# Patient Record
Sex: Female | Born: 1985 | Race: Black or African American | Hispanic: No | Marital: Single | Smoking: Never smoker
Health system: Southern US, Community
[De-identification: ages and names within clinical notes are randomized; demographics above are authoritative.]

## PROBLEM LIST (undated history)

## (undated) DIAGNOSIS — Z789 Other specified health status: Secondary | ICD-10-CM

## (undated) HISTORY — PX: NO PAST SURGERIES: SHX2092

## (undated) HISTORY — DX: Other specified health status: Z78.9

---
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
## 2007-05-18 ENCOUNTER — Emergency Department (HOSPITAL_COMMUNITY): Admission: EM | Admit: 2007-05-18 | Discharge: 2007-05-18 | Payer: Self-pay | Admitting: Emergency Medicine

## 2008-01-15 ENCOUNTER — Emergency Department (HOSPITAL_COMMUNITY): Admission: EM | Admit: 2008-01-15 | Discharge: 2008-01-15 | Payer: Self-pay | Admitting: Emergency Medicine

## 2008-01-20 ENCOUNTER — Emergency Department (HOSPITAL_COMMUNITY): Admission: EM | Admit: 2008-01-20 | Discharge: 2008-01-20 | Payer: Self-pay | Admitting: Emergency Medicine

## 2008-06-18 ENCOUNTER — Emergency Department (HOSPITAL_COMMUNITY): Admission: EM | Admit: 2008-06-18 | Discharge: 2008-06-18 | Payer: Self-pay | Admitting: Emergency Medicine

## 2008-10-02 ENCOUNTER — Emergency Department (HOSPITAL_COMMUNITY): Admission: EM | Admit: 2008-10-02 | Discharge: 2008-10-02 | Payer: Self-pay | Admitting: Emergency Medicine

## 2011-03-13 ENCOUNTER — Other Ambulatory Visit: Payer: Self-pay | Admitting: Obstetrics & Gynecology

## 2011-04-20 NOTE — Op Note (Signed)
NAMECHELLI, YERKES             ACCOUNT NO.:  [id number]   MEDICAL RECORD NO.:  [id number]          PATIENT TYPE:  INP   LOCATION:  9122                          FACILITY:  [facility]   PHYSICIAN:  Kathreen Cosier, M.D.DATE OF BIRTH:  09-Nov-1986   DATE OF PROCEDURE:  08/24/2006  DATE OF DISCHARGE:  08/26/2006                                 OPERATIVE REPORT   Patient is a primigravida at term who pushed for two hours ineffectively.  Pushed to +3 station and midline episiotomy was cut and vacuum planned.  There was a 4th degree extension.  She delivered from LOA position of a  female, Apgars 7 and team was in attendance.  The fluid was meconium stained  and the bowel and bladder was DeLee'd prior to delivery of the shoulders.  There was a nuchal cord x1 which was reduced.  The placenta __________  intact and sent to pathology.  Fourth degree was repaired in layers with 2-0  chromic.  Post repair, the rectum and anus was intact.           ______________________________  Kathreen Cosier, M.D.     BAM/MEDQ  D:  08/24/2006  T:  08/27/2006  Job:  [id number]

## 2011-07-20 ENCOUNTER — Emergency Department (HOSPITAL_BASED_OUTPATIENT_CLINIC_OR_DEPARTMENT_OTHER)
Admission: EM | Admit: 2011-07-20 | Discharge: 2011-07-20 | Disposition: A | Discharge status: Home / Self Care | Payer: 59 | Attending: Emergency Medicine | Admitting: Emergency Medicine

## 2011-07-20 DIAGNOSIS — K219 Gastro-esophageal reflux disease without esophagitis: Secondary | ICD-10-CM | POA: Insufficient documentation

## 2011-07-20 DIAGNOSIS — R1084 Generalized abdominal pain: Secondary | ICD-10-CM

## 2011-07-20 DIAGNOSIS — A499 Bacterial infection, unspecified: Secondary | ICD-10-CM | POA: Insufficient documentation

## 2011-07-20 DIAGNOSIS — N760 Acute vaginitis: Secondary | ICD-10-CM | POA: Insufficient documentation

## 2011-07-20 DIAGNOSIS — B9689 Other specified bacterial agents as the cause of diseases classified elsewhere: Secondary | ICD-10-CM | POA: Insufficient documentation

## 2011-07-20 LAB — URINALYSIS, ROUTINE W REFLEX MICROSCOPIC
Glucose, UA: NEGATIVE mg/dL
Hgb urine dipstick: NEGATIVE
Specific Gravity, Urine: 1.029 (ref 1.005–1.030)
pH: 6.5 (ref 5.0–8.0)

## 2011-07-20 LAB — CBC
Hemoglobin: 13.4 g/dL (ref 12.0–15.0)
MCH: 29.5 pg (ref 26.0–34.0)
MCV: 84.2 fL (ref 78.0–100.0)
Platelets: 199 10*3/uL (ref 150–400)
RBC: 4.55 MIL/uL (ref 3.87–5.11)
WBC: 7.6 10*3/uL (ref 4.0–10.5)

## 2011-07-20 LAB — URINE MICROSCOPIC-ADD ON

## 2011-07-20 LAB — COMPREHENSIVE METABOLIC PANEL
ALT: 14 U/L (ref 0–35)
AST: 15 U/L (ref 0–37)
CO2: 27 mEq/L (ref 19–32)
Calcium: 9.5 mg/dL (ref 8.4–10.5)
Chloride: 102 mEq/L (ref 96–112)
Creatinine, Ser: 0.6 mg/dL (ref 0.50–1.10)
GFR calc Af Amer: >60 mL/min (ref >60)
GFR calc non Af Amer: >60 mL/min (ref >60)
Glucose, Bld: 94 mg/dL (ref 70–99)
Total Bilirubin: 1.2 mg/dL (ref 0.3–1.2)

## 2011-07-20 LAB — PREGNANCY, URINE: Preg Test, Ur: NEGATIVE

## 2011-07-20 LAB — WET PREP, GENITAL: Trich, Wet Prep: NONE SEEN

## 2011-07-20 MED ORDER — METRONIDAZOLE 500 MG PO TABS: 500.0000 mg | ORAL_TABLET | Freq: Two times a day (BID) | ORAL | Status: AC

## 2011-07-20 MED ORDER — OXYCODONE-ACETAMINOPHEN 5-325 MG PO TABS
1.0000 | ORAL_TABLET | Freq: Once | ORAL | Status: AC
  Administered 2011-07-20: 1 via ORAL
  Filled 2011-07-20: qty 1

## 2011-07-20 MED ORDER — HYDROCODONE-ACETAMINOPHEN 5-500 MG PO TABS: 1.0000 | ORAL_TABLET | Freq: Four times a day (QID) | ORAL | Status: AC | PRN

## 2011-07-20 NOTE — ED Notes (Signed)
Pt in nad at this time, during triage pt was texting on her cell phone carrying on a conversation with her friend who is at the bedside.  Her friend is eating her dinner which she brought from home.

## 2011-07-20 NOTE — ED Provider Notes (Signed)
History     CSN: [id number] Arrival date & time: 07/20/2011  8:44 PM  Chief Complaint  Patient presents with  . Abdominal Pain   HPI Comments: Pt states that she has been having on going pain for several months and it would happen a couple of times a month and was relieved with ibuprofen:pt state that in the last week the symptoms have been worse in that they are more frequent and they are not relieved with ibuprofen  Patient is a 25 y.o. female presenting with abdominal pain. The history is provided by the patient. No language interpreter was used.  Abdominal Pain The primary symptoms of the illness include abdominal pain. The primary symptoms of the illness do not include fever, fatigue, nausea, vomiting, diarrhea, hematemesis, dysuria, vaginal discharge or vaginal bleeding. The current episode started more than 2 days ago. The onset of the illness was gradual. The problem has been gradually worsening.  The patient states that she believes she is currently not pregnant. The patient has not had a change in bowel habit. Additional symptoms associated with the illness include frequency. Symptoms associated with the illness do not include chills, anorexia, diaphoresis, constipation, urgency or back pain. Significant associated medical issues do not include GERD, inflammatory bowel disease or substance abuse.    History reviewed. No pertinent past medical history.  History reviewed. No pertinent past surgical history.  History reviewed. No pertinent family history.  History  Substance Use Topics  . Smoking status: Never Smoker   . Smokeless tobacco: Never Used  . Alcohol Use: Yes     social drinker    OB History    Grav Para Term Preterm Abortions TAB SAB Ect Mult Living                  Review of Systems  Constitutional: Negative for fever, chills, diaphoresis and fatigue.  Gastrointestinal: Positive for abdominal pain. Negative for nausea, vomiting, diarrhea, constipation,  anorexia and hematemesis.  Genitourinary: Positive for frequency. Negative for dysuria, urgency, vaginal bleeding and vaginal discharge.  Musculoskeletal: Negative for back pain.  All other systems reviewed and are negative.    Physical Exam  BP 135/91  Pulse 81  Temp(Src) 98.4 F (36.9 C) (Oral)  Resp 18  Ht 5\' 5"  (1.651 m)  Wt 120 lb (54.432 kg)  BMI 19.97 kg/m2  SpO2 100%  Physical Exam  Nursing note and vitals reviewed. Constitutional: She is oriented to person, place, and time. She appears well-developed and well-nourished.  HENT:  Head: Normocephalic and atraumatic.  Eyes: Pupils are equal, round, and reactive to light.  Neck: Normal range of motion.  Cardiovascular: Normal rate and regular rhythm.   Pulmonary/Chest: Effort normal and breath sounds normal.  Abdominal: Soft. Bowel sounds are normal. She exhibits no mass. There is no tenderness. There is no guarding.  Genitourinary: Cervix exhibits no motion tenderness and no discharge. Vaginal discharge found.  Musculoskeletal: Normal range of motion.  Neurological: She is alert and oriented to person, place, and time.  Skin: Skin is warm and dry.  Psychiatric: She has a normal mood and affect.    ED Course  Procedures Results for orders placed during the hospital encounter of 07/20/11  URINALYSIS, ROUTINE W REFLEX MICROSCOPIC      Component Value Range   Color, Urine YELLOW  YELLOW    Appearance CLEAR  CLEAR    Specific Gravity, Urine 1.029  1.005 - 1.030    pH 6.5  5.0 - 8.0  Glucose, UA NEGATIVE  NEGATIVE (mg/dL)   Hgb urine dipstick NEGATIVE  NEGATIVE    Bilirubin Urine NEGATIVE  NEGATIVE    Ketones, ur NEGATIVE  NEGATIVE (mg/dL)   Protein, ur NEGATIVE  NEGATIVE (mg/dL)   Urobilinogen, UA 1.0  0.0 - 1.0 (mg/dL)   Nitrite NEGATIVE  NEGATIVE    Leukocytes, UA SMALL (*) NEGATIVE   PREGNANCY, URINE      Component Value Range   Preg Test, Ur NEGATIVE    URINE MICROSCOPIC-ADD ON      Component Value Range     Squamous Epithelial / LPF RARE  RARE    WBC, UA 3-6  <3 (WBC/hpf)   Bacteria, UA RARE  RARE   WET PREP, GENITAL      Component Value Range   Yeast, Wet Prep NONE SEEN  NONE SEEN    Trich, Wet Prep NONE SEEN  NONE SEEN    Clue Cells, Wet Prep FEW (*) NONE SEEN    WBC, Wet Prep HPF POC MANY (*) NONE SEEN   CBC      Component Value Range   WBC 7.6  4.0 - 10.5 (K/uL)   RBC 4.55  3.87 - 5.11 (MIL/uL)   Hemoglobin 13.4  12.0 - 15.0 (g/dL)   HCT 47.8  29.5 - 62.1 (%)   MCV 84.2  78.0 - 100.0 (fL)   MCH 29.5  26.0 - 34.0 (pg)   MCHC 35.0  30.0 - 36.0 (g/dL)   RDW 30.8  65.7 - 84.6 (%)   Platelets 199  150 - 400 (K/uL)  COMPREHENSIVE METABOLIC PANEL      Component Value Range   Sodium 139  135 - 145 (mEq/L)   Potassium 3.7  3.5 - 5.1 (mEq/L)   Chloride 102  96 - 112 (mEq/L)   CO2 27  19 - 32 (mEq/L)   Glucose, Bld 94  70 - 99 (mg/dL)   BUN 9  6 - 23 (mg/dL)   Creatinine, Ser 9.62  0.50 - 1.10 (mg/dL)   Calcium 9.5  8.4 - 95.2 (mg/dL)   Total Protein 7.6  6.0 - 8.3 (g/dL)   Albumin 4.5  3.5 - 5.2 (g/dL)   AST 15  0 - 37 (U/L)   ALT 14  0 - 35 (U/L)   Alkaline Phosphatase 74  39 - 117 (U/L)   Total Bilirubin 1.2  0.3 - 1.2 (mg/dL)   GFR calc non Af Amer >60  >60 (mL/min)   GFR calc Af Amer >60  >60 (mL/min)   No results found.  MDM Will treat for bv and something for pain;will have pt follow up with gy and her gyn   Medical screening examination/treatment/procedure(s) were performed by non-physician practitioner and as supervising physician I was immediately available for consultation/collaboration.    Teressa Lower, NP 07/20/11 2224  Shelda Jakes, MD 07/20/11 2228

## 2011-07-20 NOTE — ED Notes (Signed)
Pt states that she has had intermittent abdominal pain for the past month.  Pt states that she was told by a nurse to take 5 tablets of ibuprofen and that it has been helping with the pain, however after taking the medication today it made the pain worse.  Pt states that she has not been vomiting, experiencing nausea, diarrhea or constipation.  Pt states that she does having burning with urination, no fever reported.

## 2011-07-21 LAB — GC/CHLAMYDIA PROBE AMP, GENITAL: GC Probe Amp, Genital: NEGATIVE

## 2011-08-31 LAB — WET PREP, GENITAL: Trich, Wet Prep: NONE SEEN

## 2011-08-31 LAB — POCT I-STAT, CHEM 8
BUN: 11
Calcium, Ion: 1.26
Creatinine, Ser: 1.1
Glucose, Bld: 83
TCO2: 26

## 2011-08-31 LAB — CBC
MCHC: 33.7
MCV: 88.5
Platelets: 318
RBC: 3.84 — ABNORMAL LOW
RDW: 12.3

## 2011-08-31 LAB — URINALYSIS, ROUTINE W REFLEX MICROSCOPIC
Protein, ur: 30 — AB
Specific Gravity, Urine: 1.018
Urobilinogen, UA: 0.2

## 2011-08-31 LAB — URINE MICROSCOPIC-ADD ON

## 2011-08-31 LAB — DIFFERENTIAL
Basophils Absolute: 0
Basophils Relative: 0
Eosinophils Absolute: 0.1
Monocytes Relative: 8
Neutrophils Relative %: 52

## 2011-08-31 LAB — POCT PREGNANCY, URINE: Operator id: 261601

## 2012-10-08 ENCOUNTER — Encounter (HOSPITAL_COMMUNITY): Payer: Self-pay | Admitting: *Deleted

## 2012-10-08 ENCOUNTER — Emergency Department (HOSPITAL_COMMUNITY)
Admission: EM | Admit: 2012-10-08 | Discharge: 2012-10-08 | Disposition: A | Discharge status: Home / Self Care | Payer: Self-pay | Attending: Emergency Medicine | Admitting: Emergency Medicine

## 2012-10-08 DIAGNOSIS — R300 Dysuria: Secondary | ICD-10-CM | POA: Insufficient documentation

## 2012-10-08 DIAGNOSIS — R51 Headache: Secondary | ICD-10-CM | POA: Insufficient documentation

## 2012-10-08 DIAGNOSIS — R109 Unspecified abdominal pain: Secondary | ICD-10-CM | POA: Insufficient documentation

## 2012-10-08 LAB — URINALYSIS, ROUTINE W REFLEX MICROSCOPIC
Glucose, UA: NEGATIVE mg/dL
Ketones, ur: 15 mg/dL — AB
Protein, ur: NEGATIVE mg/dL
Urobilinogen, UA: 1 mg/dL (ref 0.0–1.0)

## 2012-10-08 LAB — URINE MICROSCOPIC-ADD ON

## 2012-10-08 MED ORDER — SODIUM CHLORIDE 0.9 % IV BOLUS (SEPSIS)
1000.0000 mL | Freq: Once | INTRAVENOUS | Status: AC
  Administered 2012-10-08: 1000 mL via INTRAVENOUS

## 2012-10-08 MED ORDER — METOCLOPRAMIDE HCL 5 MG/ML IJ SOLN
10.0000 mg | Freq: Once | INTRAMUSCULAR | Status: AC
  Administered 2012-10-08: 10 mg via INTRAVENOUS
  Filled 2012-10-08: qty 2

## 2012-10-08 MED ORDER — KETOROLAC TROMETHAMINE 30 MG/ML IJ SOLN
30.0000 mg | Freq: Once | INTRAMUSCULAR | Status: AC
  Administered 2012-10-08: 30 mg via INTRAVENOUS
  Filled 2012-10-08: qty 1

## 2012-10-08 MED ORDER — TRAMADOL HCL 50 MG PO TABS: 50.0000 mg | ORAL_TABLET | Freq: Four times a day (QID) | ORAL | Status: DC | PRN

## 2012-10-08 MED ORDER — NAPROXEN 500 MG PO TABS: 500.0000 mg | ORAL_TABLET | Freq: Two times a day (BID) | ORAL | Status: DC

## 2012-10-08 NOTE — ED Provider Notes (Addendum)
History     CSN: [id number]  Arrival date & time 10/08/12  0104   First MD Initiated Contact with Patient 10/08/12 0250      Chief Complaint  Patient presents with  . Migraine    (Consider location/radiation/quality/duration/timing/severity/associated sxs/prior treatment) HPI Comments: 26 year old female with a history of frequent headaches presents with a worsening headache. She states that over the last several days she has had a bitemporal throbbing headache that is intermittent, fluctuates in intensity and is located in the bitemporal region, described as throbbing, worse with bright lights and loud sounds and not associated with fevers chills or stiff neck. She does admit to having some associated suprapubic tenderness and difficulty urinating. She states that this headache is similar to prior headaches that she said no it is more intense. She denies history of aneurysm, meningitis or recent trauma  Patient is a 26 y.o. female presenting with migraines. The history is provided by the patient.  Migraine    History reviewed. No pertinent past medical history.  History reviewed. No pertinent past surgical history.  No family history on file.  History  Substance Use Topics  . Smoking status: Never Smoker   . Smokeless tobacco: Never Used  . Alcohol Use: Yes     Comment: social drinker    OB History    Grav Para Term Preterm Abortions TAB SAB Ect Mult Living                  Review of Systems  All other systems reviewed and are negative.    Allergies  Review of patient's allergies indicates no known allergies.  Home Medications   Current Outpatient Rx  Name  Route  Sig  Dispense  Refill  . ACETAMINOPHEN 500 MG PO TABS   Oral   Take 1,000 mg by mouth every 6 (six) hours as needed. For pain         . IBUPROFEN 200 MG PO TABS   Oral   Take 800 mg by mouth every 6 (six) hours as needed. Pain          . NAPROXEN 500 MG PO TABS   Oral   Take 1 tablet  (500 mg total) by mouth 2 (two) times daily with a meal.   30 tablet   0   . TRAMADOL HCL 50 MG PO TABS   Oral   Take 1 tablet (50 mg total) by mouth every 6 (six) hours as needed for pain.   15 tablet   0     BP 120/78  Pulse 83  Temp 98.8 F (37.1 C) (Oral)  Resp 16  Ht 5\' 5"  (1.651 m)  Wt 127 lb (57.607 kg)  BMI 21.13 kg/m2  SpO2 100%  LMP 09/22/2012  Physical Exam  Nursing note and vitals reviewed. Constitutional: She appears well-developed and well-nourished. No distress.  HENT:  Head: Normocephalic and atraumatic.  Mouth/Throat: Oropharynx is clear and moist. No oropharyngeal exudate.  Eyes: Conjunctivae normal and EOM are normal. Pupils are equal, round, and reactive to light. Right eye exhibits no discharge. Left eye exhibits no discharge. No scleral icterus.  Neck: Normal range of motion. Neck supple. No JVD present. No thyromegaly present.  Cardiovascular: Normal rate, regular rhythm, normal heart sounds and intact distal pulses.  Exam reveals no gallop and no friction rub.   No murmur heard. Pulmonary/Chest: Effort normal and breath sounds normal. No respiratory distress. She has no wheezes. She has no rales.  Abdominal: Soft.  Bowel sounds are normal. She exhibits no distension and no mass. There is tenderness ( Mild tenderness to palpation in the suprapubic area, no guarding, no peritoneal signs, no pain at McBurney's).  Musculoskeletal: Normal range of motion. She exhibits no edema and no tenderness.  Lymphadenopathy:    She has no cervical adenopathy.  Neurological: She is alert. Coordination normal.       The patient has normal speech, normal memory, normal coordination of all 4 extremities without any limb ataxia. The patient has normal strength of all 4 extremities at the major muscle groups including shoulders, biceps, triceps, forearm, grips, quadriceps, hamstrings, calves. Normal sensation to light touch and pinprick of all 4 extremities and the trunk. No  truncal ataxia, normal gait, cranial nerves III through XII are intact.  Normal peripheral visual fields. Normal extraocular movements. Normal pupillary exam. No pronator drift, normal reflexes at the brachial radialis, biceps and patellar tendons. Normal position sense, normal temperature sensation to cold and warm  Skin: Skin is warm and dry. No rash noted. No erythema.  Psychiatric: She has a normal mood and affect. Her behavior is normal.    ED Course  Procedures (including critical care time)  Labs Reviewed  URINALYSIS, ROUTINE W REFLEX MICROSCOPIC - Abnormal; Notable for the following:    APPearance CLOUDY (*)     Ketones, ur 15 (*)     Leukocytes, UA MODERATE (*)     All other components within normal limits  URINE MICROSCOPIC-ADD ON - Abnormal; Notable for the following:    Squamous Epithelial / LPF MANY (*)     Bacteria, UA MANY (*)     All other components within normal limits  PREGNANCY, URINE  URINE CULTURE   No results found.   1.  Headache    MDM  The patient has likely migrainous type headache, there is no focal neurologic deficits and there was factors for other more serious causes such as meningitis, vital signs are normal as is her neurologic exam, the patient is stable for medications and anticipate discharge    The patient has improved symptoms after medications, urinalysis reveals dehydration but no pregnancy or infection. Vital signs remain stable, patient is awake and alert and stable for discharge, we'll treat with Naprosyn or tramadol for breakthrough pain. Followup list given for family Dr. referral.     Vida Roller, MD 10/08/12 0454  Vida Roller, MD 10/08/12 [phone]

## 2012-10-08 NOTE — ED Notes (Signed)
Pt reports developing a severe headache with photophobia, nausea, and blurry vision. Pt reports taking two 500mg  tylenol yesterday am with no relief. Pt states she has a hx of headaches, but this headache seems worse. Pt alert and oriented x 4, neuro intact.

## 2012-10-09 LAB — URINE CULTURE: Colony Count: NO GROWTH

## 2013-08-20 ENCOUNTER — Emergency Department (HOSPITAL_COMMUNITY)
Admission: EM | Admit: 2013-08-20 | Discharge: 2013-08-21 | Disposition: A | Discharge status: Home / Self Care | Payer: PRIVATE HEALTH INSURANCE | Attending: Emergency Medicine | Admitting: Emergency Medicine

## 2013-08-20 ENCOUNTER — Encounter (HOSPITAL_COMMUNITY): Payer: Self-pay | Admitting: *Deleted

## 2013-08-20 DIAGNOSIS — J069 Acute upper respiratory infection, unspecified: Secondary | ICD-10-CM

## 2013-08-20 DIAGNOSIS — G44209 Tension-type headache, unspecified, not intractable: Secondary | ICD-10-CM

## 2013-08-20 DIAGNOSIS — Z79899 Other long term (current) drug therapy: Secondary | ICD-10-CM | POA: Insufficient documentation

## 2013-08-20 DIAGNOSIS — J3489 Other specified disorders of nose and nasal sinuses: Secondary | ICD-10-CM | POA: Insufficient documentation

## 2013-08-20 DIAGNOSIS — R112 Nausea with vomiting, unspecified: Secondary | ICD-10-CM

## 2013-08-20 DIAGNOSIS — G44219 Episodic tension-type headache, not intractable: Secondary | ICD-10-CM | POA: Insufficient documentation

## 2013-08-20 MED ORDER — ONDANSETRON HCL 4 MG PO TABS: 4.0000 mg | ORAL_TABLET | Freq: Three times a day (TID) | ORAL | Status: DC | PRN

## 2013-08-20 MED ORDER — ONDANSETRON 4 MG PO TBDP
4.0000 mg | ORAL_TABLET | Freq: Once | ORAL | Status: AC
  Administered 2013-08-20: 4 mg via ORAL
  Filled 2013-08-20: qty 1

## 2013-08-20 MED ORDER — ACETAMINOPHEN 325 MG PO TABS
650.0000 mg | ORAL_TABLET | Freq: Once | ORAL | Status: AC
  Administered 2013-08-20: 650 mg via ORAL
  Filled 2013-08-20: qty 2

## 2013-08-20 NOTE — ED Notes (Signed)
Pt reports headache and cold symptoms x 2 weeks, now having green sputum production and n/v. No acute distress noted at triage.

## 2013-08-20 NOTE — ED Provider Notes (Signed)
CSN: [id number]     Arrival date & time 08/20/13  1726 History   First MD Initiated Contact with Patient 08/20/13 2027     Chief Complaint  Patient presents with  . Emesis  . URI   (Consider location/radiation/quality/duration/timing/severity/associated sxs/prior Treatment) Patient is a 27 y.o. female presenting with URI. The history is provided by the patient. No language interpreter was used.  URI Presenting symptoms: congestion, cough and rhinorrhea   Presenting symptoms: no fever and no sore throat   Congestion:    Location:  Nasal   Interferes with sleep: no     Interferes with eating/drinking: no   Cough:    Cough characteristics:  Productive   Sputum characteristics:  Green   Severity:  Mild   Onset quality:  Gradual   Duration:  2 weeks   Timing:  Intermittent   Progression:  Unchanged   Chronicity:  New Rhinorrhea:    Quality:  Clear   Severity:  Moderate   Duration:  2 weeks   Timing:  Constant   Progression:  Unchanged Severity:  Mild Onset quality:  Gradual Duration:  2 weeks Timing:  Constant Progression:  Unchanged Chronicity:  New Relieved by:  Nothing Worsened by:  Nothing tried Ineffective treatments:  None tried Associated symptoms: no headaches   Risk factors: recent illness   Risk factors: no sick contacts     History reviewed. No pertinent past medical history. History reviewed. No pertinent past surgical history. History reviewed. No pertinent family history. History  Substance Use Topics  . Smoking status: Never Smoker   . Smokeless tobacco: Never Used  . Alcohol Use: Yes     Comment: social drinker   OB History   Grav Para Term Preterm Abortions TAB SAB Ect Mult Living                 Review of Systems  Constitutional: Negative for fever.  HENT: Positive for congestion and rhinorrhea. Negative for sore throat.   Respiratory: Positive for cough. Negative for shortness of breath.   Cardiovascular: Negative for chest pain.   Gastrointestinal: Positive for nausea and vomiting. Negative for abdominal pain and diarrhea.  Genitourinary: Negative for dysuria and hematuria.  Skin: Negative for rash.  Neurological: Negative for syncope, light-headedness and headaches.  All other systems reviewed and are negative.    Allergies  Review of patient's allergies indicates no known allergies.  Home Medications   Current Outpatient Rx  Name  Route  Sig  Dispense  Refill  . acetaminophen (TYLENOL) 500 MG tablet   Oral   Take 1,000 mg by mouth every 6 (six) hours as needed. For pain         . doxycycline (VIBRA-TABS) 100 MG tablet   Oral   Take 100 mg by mouth 2 (two) times daily.         Marland Kitchen ibuprofen (ADVIL,MOTRIN) 200 MG tablet   Oral   Take 800 mg by mouth every 6 (six) hours as needed. Pain          . tretinoin (RETIN-A) 0.05 % cream   Topical   Apply 1 application topically daily.          BP 121/71  Pulse 70  Temp(Src) 98.2 F (36.8 C) (Oral)  Resp 16  SpO2 100%  LMP 08/13/2013 Physical Exam  Nursing note and vitals reviewed. Constitutional: She is oriented to person, place, and time. She appears well-developed and well-nourished.  HENT:  Head: Normocephalic and atraumatic.  Right Ear: External ear normal.  Left Ear: External ear normal.  Sinus tenderness  Eyes: EOM are normal. Pupils are equal, round, and reactive to light.  Neck: Normal range of motion. Neck supple.  Cardiovascular: Normal rate, regular rhythm, normal heart sounds and intact distal pulses.  Exam reveals no gallop and no friction rub.   No murmur heard. Pulmonary/Chest: Effort normal and breath sounds normal. No respiratory distress. She has no wheezes. She has no rales. She exhibits no tenderness.  Abdominal: Soft. Bowel sounds are normal. She exhibits no distension. There is no tenderness. There is no rebound.  Musculoskeletal: Normal range of motion. She exhibits no edema and no tenderness.  Lymphadenopathy:     She has no cervical adenopathy.  Neurological: She is alert and oriented to person, place, and time.  Skin: Skin is warm. No rash noted.  Psychiatric: She has a normal mood and affect. Her behavior is normal.    ED Course  Procedures (including critical care time) Labs Review Labs Reviewed - No data to display Imaging Review No results found.  MDM   1. Viral URI   2. Nausea and vomiting   3. Tension headache    8:40 PM Pt is a 27 y.o. female who presents to the ED with cough, congestion, sore throat and vomiting. Pt has had URI for the past 2 weeks, no fevers. No sick contacts. No history of asthma or smoking. No shortness of breath or chest pain. Pt was concerned she had 3 episodes of non-bilious non-bloody emesis today. No history of immunosuppression, not on steroids no diabetes. Pt endorses a cough of green mucus. Clear rhinorrhea.  On exam AFVSS, bilateral TMs normal, no cervical adenopathy, sinus tenderness. History and phsyical consistent with likely viral illness. No chronic lung disease, lungs CTA doubt pneumonia, pt non toxic appearing. Pt also endorses headache occipital region with trigger points in her neck. Neurologic exam: Fundoscopic exam: no papilledema, no hemorrhages, CN I-XII: grossly intact, Sensation: normal in upper and lower extremities, Strength 5/5 in both upper and lower extremities, Coordination intact. Gait normal. Headache likely tension headache. Plan to give tylenol for headache. Pt to follow up with PCP if no better in 1 week.  Pt given tylenol with relief of headache and zofran with relief of nausea. Plan to discharge pt home with script for zofran.  11:43 PM:  I have discussed the diagnosis/risks/treatment options with the patient and believe the pt to be eligible for discharge home to follow-up with PCP in 1 week. We also discussed returning to the ED immediately if new or worsening sx occur. We discussed the sx which are most concerning (e.g., worsening  symptoms) that necessitate immediate return. Any new prescriptions provided to the patient are listed below.   Discharge Medication List as of 08/20/2013 11:43 PM      The patient appears reasonably screened and/or stabilized for discharge and I doubt any other medical condition or other [facility] requiring further screening, evaluation or treatment in the ED at this time prior to discharge . Pt in agreement with discharge plan. Return precautions given. Pt discharged VSS  Pt was discussed with my attending, Dr. Kavin Leech, MD 08/21/13 [phone]

## 2013-08-21 NOTE — ED Provider Notes (Signed)
I saw and evaluated the patient, reviewed the resident's note and I agree with the findings and plan.   Presentation most c/w viral URI. No abnormal lung sound, focal neuro signs of distress. Discussed symptomatic care, as well as return precautions.   Audree Camel, MD 08/21/13 [phone]

## 2015-07-14 ENCOUNTER — Emergency Department (HOSPITAL_COMMUNITY): Payer: BLUE CROSS/BLUE SHIELD

## 2015-07-14 ENCOUNTER — Encounter (HOSPITAL_COMMUNITY): Payer: Self-pay

## 2015-07-14 ENCOUNTER — Emergency Department (HOSPITAL_COMMUNITY)
Admission: EM | Admit: 2015-07-14 | Discharge: 2015-07-15 | Disposition: A | Discharge status: Home / Self Care | Payer: BLUE CROSS/BLUE SHIELD | Attending: Emergency Medicine | Admitting: Emergency Medicine

## 2015-07-14 DIAGNOSIS — R002 Palpitations: Secondary | ICD-10-CM | POA: Insufficient documentation

## 2015-07-14 DIAGNOSIS — Z3202 Encounter for pregnancy test, result negative: Secondary | ICD-10-CM | POA: Insufficient documentation

## 2015-07-14 DIAGNOSIS — Z79899 Other long term (current) drug therapy: Secondary | ICD-10-CM | POA: Insufficient documentation

## 2015-07-14 LAB — I-STAT CHEM 8, ED
BUN: 8 mg/dL (ref 6–20)
CALCIUM ION: 1.06 mmol/L — AB (ref 1.12–1.23)
CHLORIDE: 106 mmol/L (ref 101–111)
Creatinine, Ser: 0.8 mg/dL (ref 0.44–1.00)
Glucose, Bld: 96 mg/dL (ref 65–99)
HCT: 41 % (ref 36.0–46.0)
Hemoglobin: 13.9 g/dL (ref 12.0–15.0)
POTASSIUM: 3.1 mmol/L — AB (ref 3.5–5.1)
Sodium: 136 mmol/L (ref 135–145)
TCO2: 25 mmol/L (ref 0–100)

## 2015-07-14 LAB — CBC WITH DIFFERENTIAL/PLATELET
Basophils Absolute: 0 10*3/uL (ref 0.0–0.1)
Basophils Relative: 0 % (ref 0–1)
EOS ABS: 0.1 10*3/uL (ref 0.0–0.7)
EOS PCT: 1 % (ref 0–5)
HCT: 38.1 % (ref 36.0–46.0)
Hemoglobin: 12.7 g/dL (ref 12.0–15.0)
LYMPHS ABS: 3.2 10*3/uL (ref 0.7–4.0)
Lymphocytes Relative: 47 % — ABNORMAL HIGH (ref 12–46)
MCH: 28.4 pg (ref 26.0–34.0)
MCHC: 33.3 g/dL (ref 30.0–36.0)
MCV: 85.2 fL (ref 78.0–100.0)
Monocytes Absolute: 0.6 10*3/uL (ref 0.1–1.0)
Monocytes Relative: 8 % (ref 3–12)
Neutro Abs: 3 10*3/uL (ref 1.7–7.7)
Neutrophils Relative %: 44 % (ref 43–77)
PLATELETS: 211 10*3/uL (ref 150–400)
RBC: 4.47 MIL/uL (ref 3.87–5.11)
RDW: 13.7 % (ref 11.5–15.5)
WBC: 6.8 10*3/uL (ref 4.0–10.5)

## 2015-07-14 LAB — D-DIMER, QUANTITATIVE: D-Dimer, Quant: 0.36 ug/mL-FEU (ref 0.00–0.48)

## 2015-07-14 LAB — POC URINE PREG, ED: PREG TEST UR: NEGATIVE

## 2015-07-14 MED ORDER — POTASSIUM CHLORIDE CRYS ER 20 MEQ PO TBCR
40.0000 meq | EXTENDED_RELEASE_TABLET | Freq: Once | ORAL | Status: AC
  Administered 2015-07-14: 40 meq via ORAL
  Filled 2015-07-14: qty 2

## 2015-07-14 NOTE — ED Provider Notes (Signed)
CSN: [id number]     Arrival date & time 07/14/15  2009 History   First MD Initiated Contact with Patient 07/14/15 2040     Chief Complaint  Patient presents with  . Palpitations     (Consider location/radiation/quality/duration/timing/severity/associated sxs/prior Treatment) HPI   29 year old female without any significant past medical history presenting for evaluation of heart palpitations.patient reports for the past 3-4 days she has had recurrent heart palpitation and elevated heart rate. She reports symptoms would last for hours with associate lightheadedness, dizziness,, feeling nauseous, and tingling sensation to her extremities. Her symptoms return prompting her to come to the ER. She initially was seen at urgent care today for complaint and states that she had an abnormal EKG at that time and they recommend her to be evaluated in the ED. Patient admits that she has been under some stress planning for her wedding. She hasn't been sleeping well, and had been drinking more coffee and taking double shots of espresso's for the past few days. She does not normally drink coffee. She denies having any significant family history of cardiac disease. Denies any history of exertional syncope. She is not a smoker. She does endorse pleuritic chest pain and having shortness of breath worsen with exertion.she denies any prior history of PE or DVT, no recent surgery, prolonged bed rest, unilateral leg swelling or calf pain, active cancer, or taking oral birth control.  History reviewed. No pertinent past medical history. History reviewed. No pertinent past surgical history. History reviewed. No pertinent family history. Social History  Substance Use Topics  . Smoking status: Never Smoker   . Smokeless tobacco: Never Used  . Alcohol Use: Yes     Comment: social drinker   OB History    No data available     Review of Systems  All other systems reviewed and are negative.     Allergies  Review  of patient's allergies indicates no known allergies.  Home Medications   Prior to Admission medications   Medication Sig Start Date End Date Taking? Authorizing Provider  acetaminophen (TYLENOL) 500 MG tablet Take 1,000 mg by mouth every 6 (six) hours as needed. For pain    Historical Provider, MD  doxycycline (VIBRA-TABS) 100 MG tablet Take 100 mg by mouth 2 (two) times daily.    Historical Provider, MD  ibuprofen (ADVIL,MOTRIN) 200 MG tablet Take 800 mg by mouth every 6 (six) hours as needed. Pain     Historical Provider, MD  ondansetron (ZOFRAN) 4 MG tablet Take 1 tablet (4 mg total) by mouth every 8 (eight) hours as needed for nausea. 08/20/13   Donalee Citrin, MD  tretinoin (RETIN-A) 0.05 % cream Apply 1 application topically daily.    Historical Provider, MD   BP 120/82 mmHg  Pulse 75  Temp(Src) 99 F (37.2 C) (Oral)  Resp 20  SpO2 100% Physical Exam  Constitutional: She appears well-developed and well-nourished. No distress.  HENT:  Head: Atraumatic.  Eyes: Conjunctivae are normal.  Neck: Neck supple. No JVD present.  Cardiovascular: Normal rate and regular rhythm.   Pulmonary/Chest: Effort normal and breath sounds normal.  Abdominal: Soft. There is no tenderness.  Musculoskeletal: She exhibits no edema.  Bilateral lower extremities without palpable cords, erythema, or edema.  Neurological: She is alert.  Skin: No rash noted.  Psychiatric: She has a normal mood and affect.  Nursing note and vitals reviewed.   ED Course  Procedures (including critical care time)  Patient presents complaining of heart palpitation, generalized  weakness, and having pleuritic chest pain and shortness of breath. She is not having significant risk factors concerning for PE. D-dimer ordered. Workup initiated.  I suspect her symptoms may be exacerbated by stress, and increase caffeine intake.  EKG without acute changes.  Normal orthostatic vital sign. Her d-dimer is negative, I have low suspicion  for PE. Mild hypokalemia with potassium of 3.1, supplementation given. The remainder of her labs are unremarkable. Her pregnancy test is negative for chest x-ray is normal.at this time patient is stable for discharge. Recommend follow-up with PCP for further evaluation. Return precautions discussed.  Labs Review Labs Reviewed  CBC WITH DIFFERENTIAL/PLATELET - Abnormal; Notable for the following:    Lymphocytes Relative 47 (*)    All other components within normal limits  I-STAT CHEM 8, ED - Abnormal; Notable for the following:    Potassium 3.1 (*)    Calcium, Ion 1.06 (*)    All other components within normal limits  D-DIMER, QUANTITATIVE (NOT AT [facility])  POC URINE PREG, ED    Imaging Review Dg Chest 2 View  07/14/2015   CLINICAL DATA:  Upper left chest pain and heart fluttering since 08/08, short of breath tonight and chills  EXAM: CHEST  2 VIEW  COMPARISON:  02/16/2008  FINDINGS: The heart size and mediastinal contours are within normal limits. Both lungs are clear. No pleural effusion or pneumothorax. The visualized skeletal structures are unremarkable.  IMPRESSION: No active cardiopulmonary disease.   Electronically Signed   By: Amie Portland M.D.   On: 07/14/2015 21:47   I, Geniva Lohnes, personally reviewed and evaluated these images and lab results as part of my medical decision-making.   EKG Interpretation None      Date: 07/14/2015  Rate: 78  Rhythm: normal sinus rhythm  QRS Axis: normal  Intervals: normal  ST/T Wave abnormalities: normal  Conduction Disutrbances: none  Narrative Interpretation:   Old EKG Reviewed: No significant changes noted     MDM   Final diagnoses:  Heart palpitations    BP 100/64 mmHg  Pulse 88  Temp(Src) 99 F (37.2 C) (Oral)  Resp 21  SpO2 100%  I have reviewed nursing notes and vital signs. I personally viewed the imaging tests through PACS system and agrees with radiologist's intepretation I reviewed available ER/hospitalization  records through the EMR     Fayrene Helper, PA-C 07/14/15 2359  Elwin Mocha, MD 07/15/15 1501

## 2015-07-14 NOTE — ED Notes (Signed)
Delay lab draw pt in bathroom

## 2015-07-14 NOTE — ED Notes (Signed)
Pt complains of heart palpitations, dizziness and nausea especially when laying down since Monday, she was seen earlier at the Urgent Care and had an abnormal EKG and sent here for further evaluation.

## 2015-07-14 NOTE — Discharge Instructions (Signed)
Palpitations °A palpitation is the feeling that your heartbeat is irregular or is faster than normal. It may feel like your heart is fluttering or skipping a beat. Palpitations are usually not a serious problem. However, in some cases, you may need further medical evaluation. °CAUSES  °Palpitations can be caused by: °· Smoking. °· Caffeine or other stimulants, such as diet pills or energy drinks. °· Alcohol. °· Stress and anxiety. °· Strenuous physical activity. °· Fatigue. °· Certain medicines. °· Heart disease, especially if you have a history of irregular heart rhythms (arrhythmias), such as atrial fibrillation, atrial flutter, or supraventricular tachycardia. °· An improperly working pacemaker or defibrillator. °DIAGNOSIS  °To find the cause of your palpitations, your health care provider will take your medical history and perform a physical exam. Your health care provider may also have you take a test called an ambulatory electrocardiogram (ECG). An ECG records your heartbeat patterns over a 24-hour period. You may also have other tests, such as: °· Transthoracic echocardiogram (TTE). During echocardiography, sound waves are used to evaluate how blood flows through your heart. °· Transesophageal echocardiogram (TEE). °· Cardiac monitoring. This allows your health care provider to monitor your heart rate and rhythm in real time. °· Holter monitor. This is a portable device that records your heartbeat and can help diagnose heart arrhythmias. It allows your health care provider to track your heart activity for several days, if needed. °· Stress tests by exercise or by giving medicine that makes the heart beat faster. °TREATMENT  °Treatment of palpitations depends on the cause of your symptoms and can vary greatly. Most cases of palpitations do not require any treatment other than time, relaxation, and monitoring your symptoms. Other causes, such as atrial fibrillation, atrial flutter, or supraventricular  tachycardia, usually require further treatment. °HOME CARE INSTRUCTIONS  °· Avoid: °¨ Caffeinated coffee, tea, soft drinks, diet pills, and energy drinks. °¨ Chocolate. °¨ Alcohol. °· Stop smoking if you smoke. °· Reduce your stress and anxiety. Things that can help you relax include: °¨ A method of controlling things in your body, such as your heartbeats, with your mind (biofeedback). °¨ Yoga. °¨ Meditation. °¨ Physical activity such as swimming, jogging, or walking. °· Get plenty of rest and sleep. °SEEK MEDICAL CARE IF:  °· You continue to have a fast or irregular heartbeat beyond 24 hours. °· Your palpitations occur more often. °SEEK IMMEDIATE MEDICAL CARE IF: °· You have chest pain or shortness of breath. °· You have a severe headache. °· You feel dizzy or you faint. °MAKE SURE YOU: °· Understand these instructions. °· Will watch your condition. °· Will get help right away if you are not doing well or get worse. °Document Released: 11/16/2000 Document Revised: 11/24/2013 Document Reviewed: 01/18/2012 °ExitCare® Patient Information ©2015 [employer]. This information is not intended to replace advice given to you by your health care provider. Make sure you discuss any questions you have with your health care provider. ° ° °Emergency Department Resource Guide °1) Find a Doctor and Pay Out of Pocket °Although you won't have to find out who is covered by your insurance plan, it is a good idea to ask around and get recommendations. You will then need to call the office and see if the doctor you have chosen will accept you as a new patient and what types of options they offer for patients who are self-pay. Some doctors offer discounts or will set up payment plans for their patients who do not have insurance, but   you will need to ask so you aren't surprised when you get to your appointment.  2) Contact Your Local Health Department Not all health departments have doctors that can see patients for sick visits, but  many do, so it is worth a call to see if yours does. If you don't know where your local health department is, you can check in your phone book. The CDC also has a tool to help you locate your state's health department, and many state websites also have listings of all of their local health departments.  3) Find a Elim Clinic If your illness is not likely to be very severe or complicated, you may want to try a walk in clinic. These are popping up all over the country in pharmacies, drugstores, and shopping centers. They're usually staffed by nurse practitioners or physician assistants that have been trained to treat common illnesses and complaints. They're usually fairly quick and inexpensive. However, if you have serious medical issues or chronic medical problems, these are probably not your best option.  No Primary Care Doctor: - Call Health Connect at  [phone] - they can help you locate a primary care doctor that  accepts your insurance, provides certain services, etc. - Physician Referral Service- [phone]  Chronic Pain Problems: Organization         Address  Phone   Notes  [facility]  [phone] Patients need to be referred by their primary care doctor.   Medication Assistance: Organization         Address  Phone   Notes  [facility] Medication [facility] [address] [place], [place] [address] [phone] --Must be a resident of [facility] -- Must have NO insurance coverage whatsoever (no Medicaid/ Medicare, etc.) -- The pt. MUST have a primary care doctor that directs their care regularly and follows them in the community   [facility]ist  [phone]   [employer]  [phone]    Agencies that provide inexpensive medical care: Organization         Address  Phone   Notes  [place]  [phone]   [facility]    [phone]   [facility] [place], [place] [address] [phone]   [place] [address], [place] [phone]   Planned Parenthood    [phone]   [facility]    [phone]   [place] and [place] [address], [place] Phone:  [phone], Fax:  [phone] Hours of Operation:  9 am - 6 pm, M-F.  Also accepts Medicaid/Medicare and self-pay.  [facility] for [place] [address], [address], [place] Phone: [phone], Fax: [phone]. Hours of Operation:  8:30 am - 5:30 pm, M-F.  Also accepts Medicaid and self-pay.  [facility] [address] [place] Phone: [phone]   [place], [place], [place] [phone], Ext. [phone] Mondays & Thursdays: 7-9 AM.  First 15 patients are seen on a first come, first serve basis.    [place] Providers:  Organization         Address  Phone   Notes  [facility] [address], [place] [phone] Also accepts self-pay patients.  [facility] [address], [place]  [place]  [phone]   [place], [address], [place] [phone]   [place] [address] [place] [phone]   Lucianne Lei [address], [place]   [phone] Only accepts [place] Access [place] patients after they have their name applied to their card.   Self-Pay (no insurance) in [facility]:  Organization         Address  Phone   Notes  Sickle Cell Patients, [facility] [place] [phone]   [facility] [place] [phone]   [facility] [place]  [place], [place], [place] [phone]   [facility]/Dr. Osei-Bonsu  [address] [place] or  [address], [place] [phone] Phone number for both [place] and [place] locations is the same.  [facility] [address] [place] [phone]   [facility] [address], [place] or [address] [phone]   [facility] [address], [place] [phone], phone; [phone], fax Sees patients 1st and 3rd Saturday of every month.  Must not qualify for public or private insurance (i.e. Medicaid, Medicare, [place] Health Choice, Veterans' Benefits)  Household income should be no more than 200% of the poverty level The clinic cannot treat you if you are pregnant or think you are pregnant  Sexually transmitted diseases are not treated at the clinic.    Dental Care: Organization         Address  Phone  Notes  [facility] Department of [facility] [phone] Accepts children up to age 31 who are enrolled in [place] or [place]; pregnant women with a Medicaid card; and children who have applied for Medicaid or [place] Health Choice, but were declined, whose parents can pay a reduced fee at time of service.  [facility] Department of [facility]  [address], [place] [phone] Accepts children up to age 43 who are enrolled in [place] or [place]; pregnant women with a Medicaid card; and children who have applied for Medicaid or [place] Health Choice, but were declined, whose parents can pay a reduced fee at time of service.  [place] [facility] Access PRO[facility]  [place] [phone] Patients are seen by appointment only. Walk-ins are not accepted. [place] will see patients 41 years of age and older. Monday - Tuesday (8am-5pm) Most Wednesdays (8:30-5pm) $30 per visit, cash only  [facility] Adult Dental Access PROGRAM  [address], [facility] [phone] Patients are seen by appointment only. Walk-ins are not accepted. [place] will see patients 43 years of age and older. One Wednesday Evening (Monthly: Volunteer Based).  $30 per visit, cash only  [place]  [phone] for adults; Children under age 67, call Graduate Pediatric Dentistry at [phone]. Children aged 40-14, please call [phone] to request a pediatric application.  Dental services are provided in all areas of dental care including fillings, crowns and bridges, complete and partial dentures, implants, gum treatment, root canals, and extractions. Preventive care is also provided. Treatment is provided to both adults and children. Patients are selected via a lottery  and there is often a waiting list.   [facility] [address] [place]  [phone] [email or web address]   [facility] [address] [place], [place] [phone], Ext. [phone] Second and Fourth Thursday of each month, opens at 6:30 AM; Clinic ends at 9 AM.  Patients are seen on a first-come first-served basis, and a limited number are seen during each clinic.   [facility]  [address] Hillard Danker [place], [place] [phone]   Eligibility Requirements You must have lived in [place], [place], or [place] counties for at least the last three months.   You cannot be eligible for state or federal sponsored [employer], including [employer], [place], or [employer].   You generally cannot be eligible for healthcare insurance through your employer.    How to apply: Eligibility screenings are held every Tuesday and Wednesday afternoon from 1:00 pm until 4:00 pm. You do not need an appointment for the interview!  [facility] [address], [place], [place]   [place]  Blue Department  [place]  [phone]    Behavioral Health Resources in the Community: Intensive Outpatient Programs Organization         Address  Phone  Notes  [place]. [address] [place], [place] [phone]   [facility] Outpatient [address] [place], [place]   ADS: Alcohol & Drug Svcs [address], [place], [place]   [place] [address],  [place], [place] or [phone]   Substance Abuse Resources Organization         Address  Phone  Notes  Alcohol and Drug Services  [phone]   [place]  [phone]   [facility] [place]   Chinita Pester  [phone]   Residential & Outpatient Substance Abuse Program  [phone]   Psychological Services Organization         Address  Phone  Notes  [facility] [place]  [place]  [phone]   [place] [address], [place] or [phone]    Mobile Crisis Teams Organization         Address  Phone  Notes  [facility], Mobile Crisis Care Unit  [phone]   [facility] Psychotherapeutic [facility]  [address] [place], [place]   [place] [address], [place] [phone]    Self-Help/Support Groups Organization         Address  Phone             Notes  [place] - variety of support groups  [place] Call for more information  Narcotics Anonymous (NA), [facility] [address], [employer] [place]  2 meetings at this location   Special educational needs teacher         Address  Phone  Notes  ASAP Residential Treatment [place],    [place]  [phone]   [facility]  [address], [place] [id number], [place], [place]   [place], [place] [phone]  Admissions: 8am-3pm M-F  Incentives Substance [place] [address]    [place], [place] [phone]   [facility] [address], [place],  [place] [phone]   [facility] [address]  [place], [place]   [facility] - Intensive Outpatient [address], [place], [place]   [facility] ([place].,  [place], [place] [phone] or [phone]   [facility] Services (RTS) [address] [place], [place] Accepts Medicaid  [facility] [place] [address],  [place] [phone] Substance Abuse/Addiction Treatment   [facility] Organization         Address  Phone  Notes  [facility]  [phone]   Domenic Schwab, PhD [address] [place]lis [place], [place]   [phone] or [phone]   [place], [place] [phone]   [address], [place], [place] [phone] Insurance/Medicaid/sponsorship through [facility] and Families [address], [address] [place]                                    [place], [place] [phone] [place] [address], [place] [phone]    Dr. Adele Schilder  [phone]   [facility] Dept. 1) [address] [place] 2) [place] 3)  [place] [address], [place] [phone]  [phone]   [place] [phone] or [phone] (After Hours)

## 2016-07-18 IMAGING — CR DG CHEST 2V
2 series · 2 of 2 positions shown · non-contrast
Comparison: 02/16/2008

CLINICAL DATA: Upper left chest pain and heart fluttering since
[DATE], short of breath tonight and chills

EXAM:
CHEST  2 VIEW

[w chest pa]
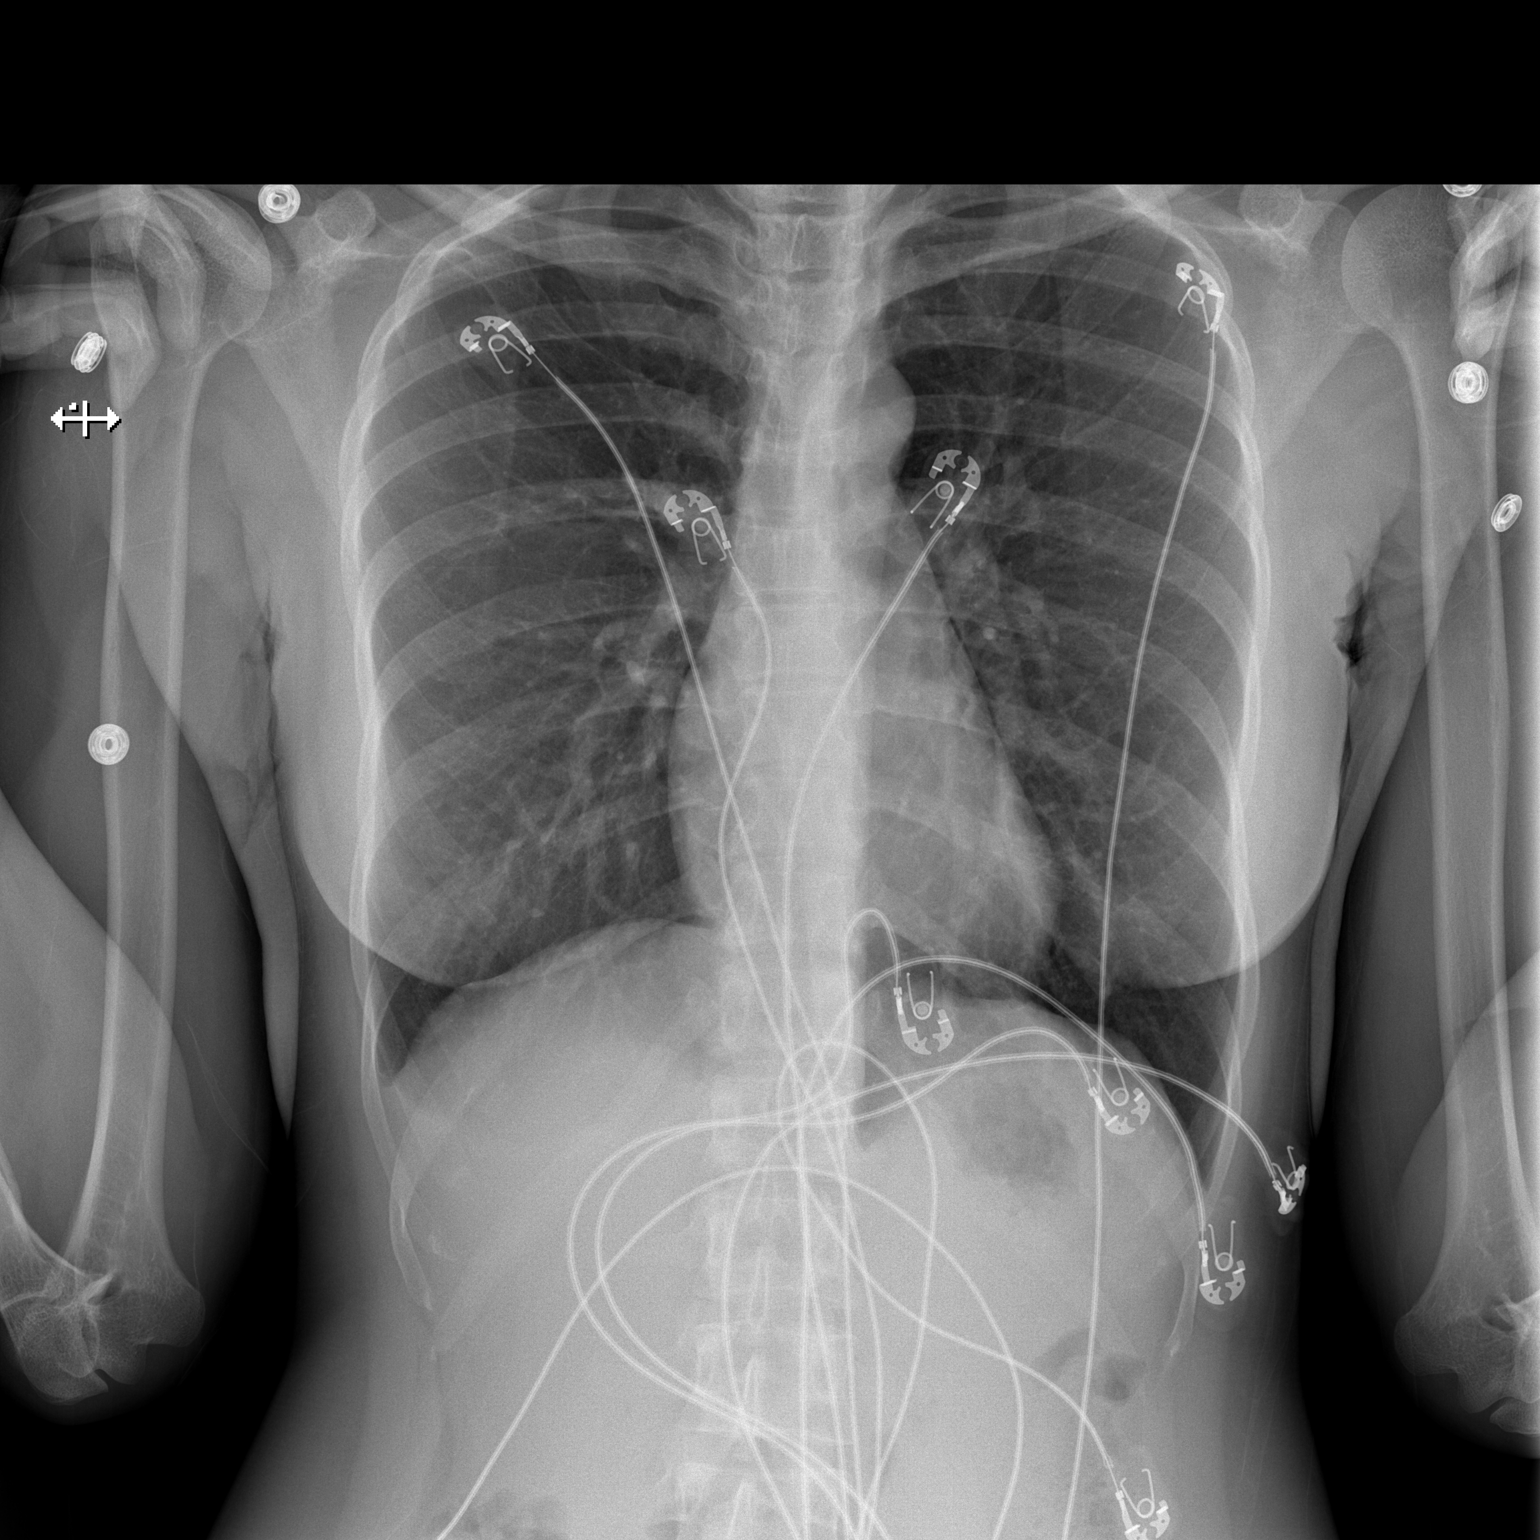

[w chest lat]
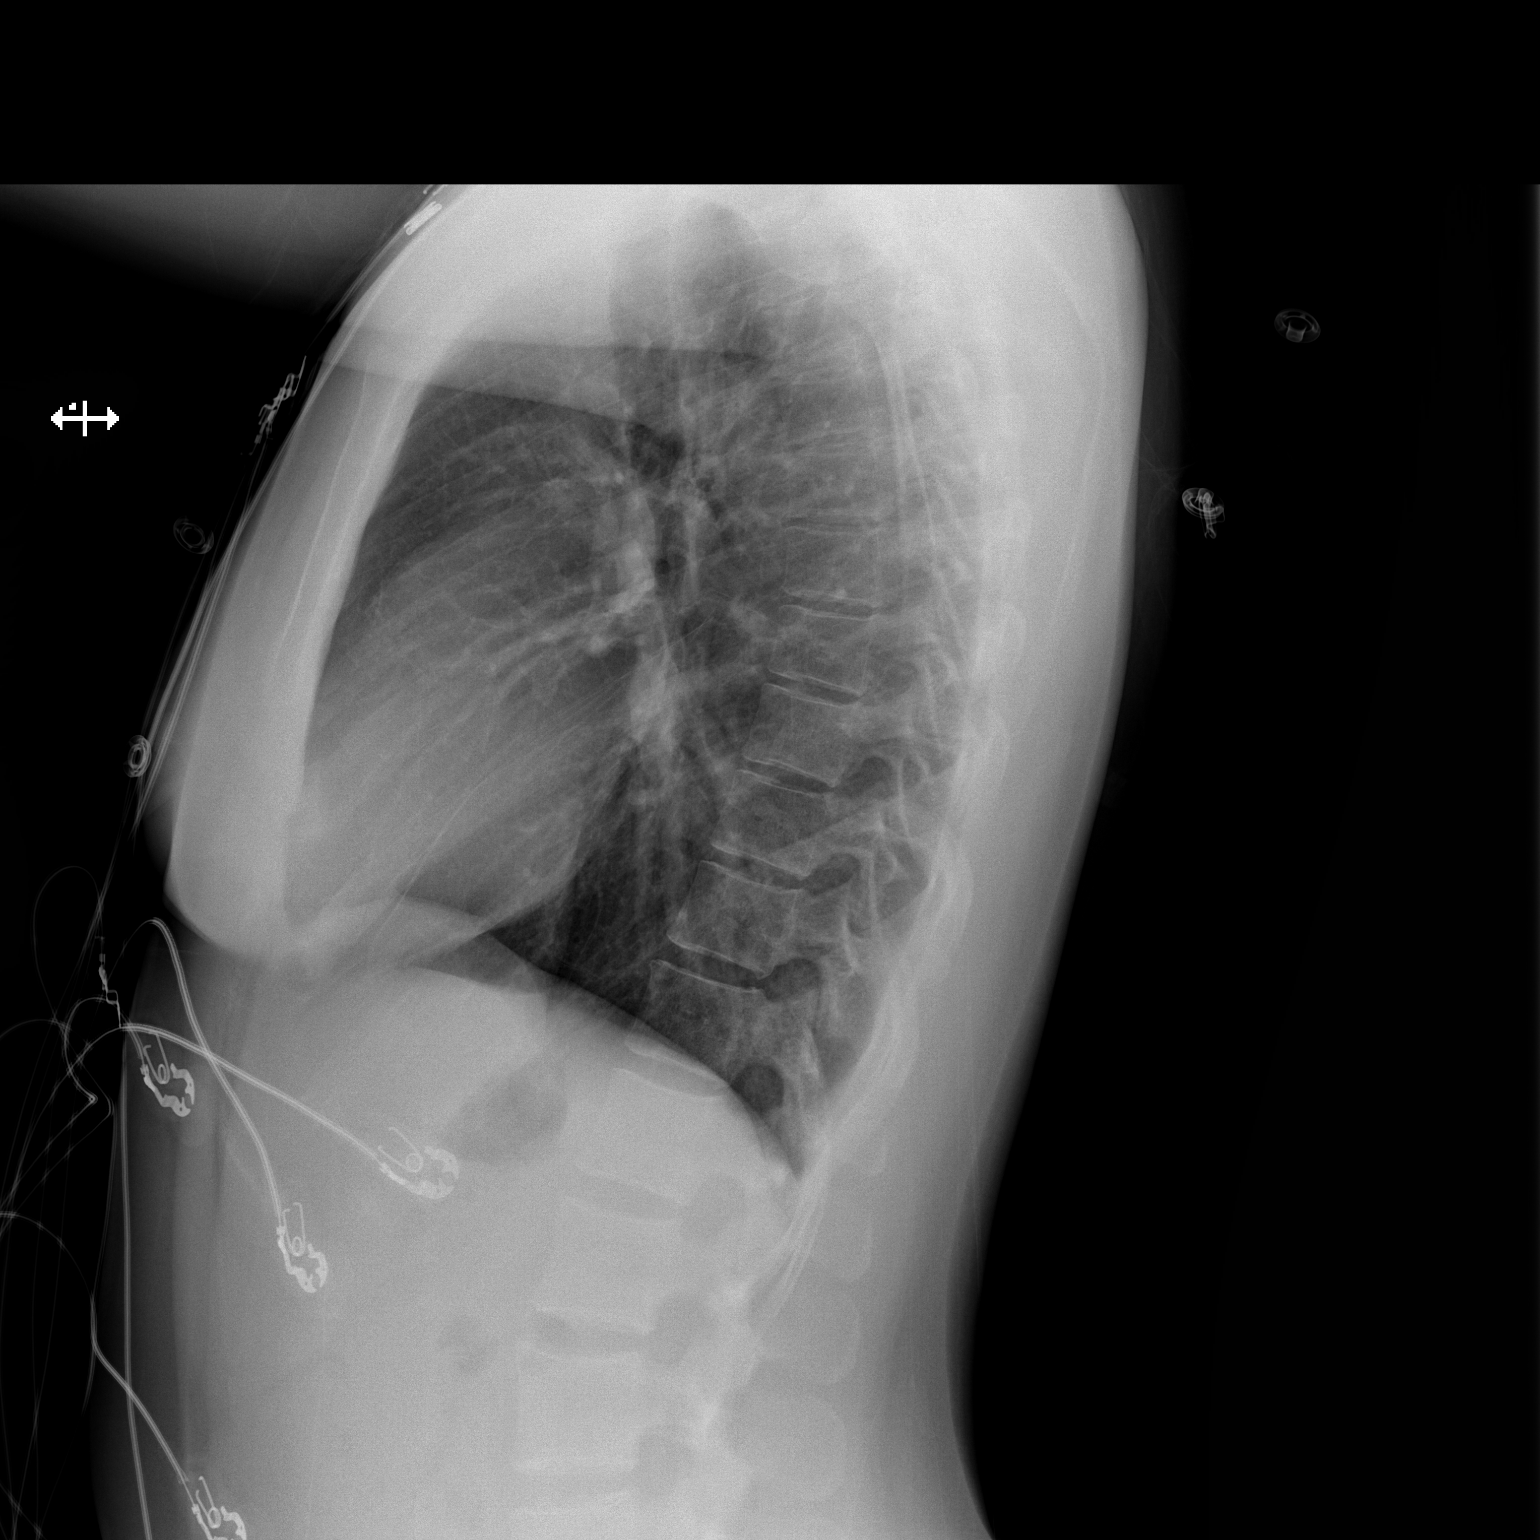

[2 of 2 positions shown; findings below may reference images not displayed]

FINDINGS: The heart size and mediastinal contours are within normal limits.
Both lungs are clear. No pleural effusion or pneumothorax. The
visualized skeletal structures are unremarkable.
IMPRESSION: No active cardiopulmonary disease.

## 2019-12-04 NOTE — L&D Delivery Note (Signed)
Delivery Note Pt pushed for a little over an hour and at 7:44 AM a viable female was delivered via  (Presentation: LOA     ).  APGAR:8 ,9 ; weight pending .   Upon delivery of infants head, turtle sign noted. Staff was alerted. Pt placed in McRoberts position.  Anterior should was located and direction for suprapubic pressure communicated with nurse. Pressure applied with next push and anterior shoulder dislodged. The next two pushes effectively delivered the posterior shoulder. Infant placed on mothers abdomen while cord clamped and cut. Vigorous spontaneous cry noted while cord being cut. Next, placenta status: delivered, intact, shultz  .  Cord:3vc   with the following complications: none .  Cord pH: n/a  Anesthesia: Epidural Episiotomy:  n/a Lacerations:  Perineal abrasion only Suture Repair: n/a Est. Blood Loss (mL):  Mom to postpartum.  Baby to Couplet care / Skin to Skin  Pt desires circumcision for baby.  Cathrine Muster 11/11/2020, 7:58 AM

## 2020-05-26 LAB — OB RESULTS CONSOLE GC/CHLAMYDIA
Chlamydia: NEGATIVE
Gonorrhea: NEGATIVE

## 2020-05-26 LAB — OB RESULTS CONSOLE HEPATITIS B SURFACE ANTIGEN: Hepatitis B Surface Ag: NEGATIVE

## 2020-05-26 LAB — OB RESULTS CONSOLE HIV ANTIBODY (ROUTINE TESTING): HIV: NONREACTIVE

## 2020-11-03 ENCOUNTER — Telehealth (HOSPITAL_COMMUNITY): Payer: Self-pay | Admitting: *Deleted

## 2020-11-03 ENCOUNTER — Encounter (HOSPITAL_COMMUNITY): Payer: Self-pay | Admitting: *Deleted

## 2020-11-03 NOTE — Telephone Encounter (Signed)
Preadmission screen  

## 2020-11-08 ENCOUNTER — Other Ambulatory Visit (HOSPITAL_COMMUNITY)
Admission: RE | Admit: 2020-11-08 | Discharge: 2020-11-08 | Disposition: A | Discharge status: Home / Self Care | Payer: 59 | Source: Ambulatory Visit | Attending: Obstetrics and Gynecology | Admitting: Obstetrics and Gynecology

## 2020-11-08 DIAGNOSIS — Z01812 Encounter for preprocedural laboratory examination: Secondary | ICD-10-CM | POA: Insufficient documentation

## 2020-11-08 DIAGNOSIS — Z20822 Contact with and (suspected) exposure to covid-19: Secondary | ICD-10-CM | POA: Insufficient documentation

## 2020-11-08 LAB — SARS CORONAVIRUS 2 (TAT 6-24 HRS): SARS Coronavirus 2: NEGATIVE

## 2020-11-10 ENCOUNTER — Encounter (HOSPITAL_COMMUNITY): Payer: Self-pay | Admitting: Obstetrics and Gynecology

## 2020-11-10 ENCOUNTER — Other Ambulatory Visit: Payer: Self-pay

## 2020-11-10 ENCOUNTER — Inpatient Hospital Stay (HOSPITAL_COMMUNITY)
Admission: AD | Admit: 2020-11-10 | Discharge: 2020-11-12 | DRG: 807 | Disposition: A | Discharge status: Home / Self Care | Payer: 59 | Attending: Obstetrics and Gynecology | Admitting: Obstetrics and Gynecology

## 2020-11-10 ENCOUNTER — Inpatient Hospital Stay (HOSPITAL_COMMUNITY): Payer: 59 | Admitting: Anesthesiology

## 2020-11-10 ENCOUNTER — Inpatient Hospital Stay (HOSPITAL_COMMUNITY): Payer: 59

## 2020-11-10 DIAGNOSIS — Z3A39 39 weeks gestation of pregnancy: Secondary | ICD-10-CM | POA: Diagnosis not present

## 2020-11-10 DIAGNOSIS — O26893 Other specified pregnancy related conditions, third trimester: Secondary | ICD-10-CM | POA: Diagnosis present

## 2020-11-10 DIAGNOSIS — Z20822 Contact with and (suspected) exposure to covid-19: Secondary | ICD-10-CM | POA: Diagnosis present

## 2020-11-10 DIAGNOSIS — Z3490 Encounter for supervision of normal pregnancy, unspecified, unspecified trimester: Secondary | ICD-10-CM

## 2020-11-10 DIAGNOSIS — O99824 Streptococcus B carrier state complicating childbirth: Secondary | ICD-10-CM | POA: Diagnosis present

## 2020-11-10 LAB — CBC
HCT: 34.5 % — ABNORMAL LOW (ref 36.0–46.0)
Hemoglobin: 11 g/dL — ABNORMAL LOW (ref 12.0–15.0)
MCH: 27.6 pg (ref 26.0–34.0)
MCHC: 31.9 g/dL (ref 30.0–36.0)
MCV: 86.7 fL (ref 80.0–100.0)
Platelets: 226 10*3/uL (ref 150–400)
RBC: 3.98 MIL/uL (ref 3.87–5.11)
RDW: 14.7 % (ref 11.5–15.5)
WBC: 7.6 10*3/uL (ref 4.0–10.5)
nRBC: 0 % (ref 0.0–0.2)

## 2020-11-10 LAB — OB RESULTS CONSOLE HIV ANTIBODY (ROUTINE TESTING): HIV: NONREACTIVE

## 2020-11-10 LAB — OB RESULTS CONSOLE RUBELLA ANTIBODY, IGM: Rubella: IMMUNE

## 2020-11-10 LAB — OB RESULTS CONSOLE GBS: GBS: POSITIVE

## 2020-11-10 LAB — TYPE AND SCREEN
ABO/RH(D): O POS
Antibody Screen: NEGATIVE

## 2020-11-10 LAB — RPR: RPR Ser Ql: NONREACTIVE

## 2020-11-10 MED ORDER — LACTATED RINGERS IV SOLN
500.0000 mL | Freq: Once | INTRAVENOUS | Status: AC
  Administered 2020-11-10: 500 mL via INTRAVENOUS

## 2020-11-10 MED ORDER — EPHEDRINE 5 MG/ML INJ: 10.0000 mg | INTRAVENOUS | Status: DC | PRN

## 2020-11-10 MED ORDER — ACETAMINOPHEN 325 MG PO TABS: 650.0000 mg | ORAL_TABLET | ORAL | Status: DC | PRN

## 2020-11-10 MED ORDER — OXYCODONE-ACETAMINOPHEN 5-325 MG PO TABS: 1.0000 | ORAL_TABLET | ORAL | Status: DC | PRN

## 2020-11-10 MED ORDER — LACTATED RINGERS IV SOLN: 500.0000 mL | INTRAVENOUS | Status: DC | PRN

## 2020-11-10 MED ORDER — MISOPROSTOL 25 MCG QUARTER TABLET
25.0000 ug | ORAL_TABLET | ORAL | Status: DC | PRN
  Administered 2020-11-10 (×2): 25 ug via VAGINAL
  Filled 2020-11-10 (×2): qty 1

## 2020-11-10 MED ORDER — TERBUTALINE SULFATE 1 MG/ML IJ SOLN: 0.2500 mg | Freq: Once | INTRAMUSCULAR | Status: DC | PRN

## 2020-11-10 MED ORDER — OXYTOCIN-SODIUM CHLORIDE 30-0.9 UT/500ML-% IV SOLN
2.5000 [IU]/h | INTRAVENOUS | Status: DC
  Filled 2020-11-10: qty 500

## 2020-11-10 MED ORDER — BUTORPHANOL TARTRATE 1 MG/ML IJ SOLN
1.0000 mg | INTRAMUSCULAR | Status: DC | PRN
  Administered 2020-11-10: 1 mg via INTRAVENOUS
  Filled 2020-11-10: qty 1

## 2020-11-10 MED ORDER — LIDOCAINE HCL (PF) 1 % IJ SOLN
INTRAMUSCULAR | Status: DC | PRN
  Administered 2020-11-10: 10 mL via EPIDURAL

## 2020-11-10 MED ORDER — OXYCODONE-ACETAMINOPHEN 5-325 MG PO TABS: 2.0000 | ORAL_TABLET | ORAL | Status: DC | PRN

## 2020-11-10 MED ORDER — OXYTOCIN-SODIUM CHLORIDE 30-0.9 UT/500ML-% IV SOLN
1.0000 m[IU]/min | INTRAVENOUS | Status: DC
  Administered 2020-11-10: 2 m[IU]/min via INTRAVENOUS

## 2020-11-10 MED ORDER — ONDANSETRON HCL 4 MG/2ML IJ SOLN
4.0000 mg | Freq: Four times a day (QID) | INTRAMUSCULAR | Status: DC | PRN
  Administered 2020-11-10 – 2020-11-11 (×2): 4 mg via INTRAVENOUS
  Filled 2020-11-10 (×2): qty 2

## 2020-11-10 MED ORDER — FENTANYL-BUPIVACAINE-NACL 0.5-0.125-0.9 MG/250ML-% EP SOLN
12.0000 mL/h | EPIDURAL | Status: DC | PRN
  Filled 2020-11-10: qty 250

## 2020-11-10 MED ORDER — PHENYLEPHRINE 40 MCG/ML (10ML) SYRINGE FOR IV PUSH (FOR BLOOD PRESSURE SUPPORT)
80.0000 ug | PREFILLED_SYRINGE | INTRAVENOUS | Status: DC | PRN
  Filled 2020-11-10: qty 10

## 2020-11-10 MED ORDER — SOD CITRATE-CITRIC ACID 500-334 MG/5ML PO SOLN: 30.0000 mL | ORAL | Status: DC | PRN

## 2020-11-10 MED ORDER — SODIUM CHLORIDE (PF) 0.9 % IJ SOLN
INTRAMUSCULAR | Status: DC | PRN
  Administered 2020-11-10: 12 mL/h via EPIDURAL

## 2020-11-10 MED ORDER — PENICILLIN G POT IN DEXTROSE 60000 UNIT/ML IV SOLN
3.0000 10*6.[IU] | INTRAVENOUS | Status: DC
  Administered 2020-11-10 – 2020-11-11 (×5): 3 10*6.[IU] via INTRAVENOUS
  Filled 2020-11-10 (×5): qty 50

## 2020-11-10 MED ORDER — PHENYLEPHRINE 40 MCG/ML (10ML) SYRINGE FOR IV PUSH (FOR BLOOD PRESSURE SUPPORT)
80.0000 ug | PREFILLED_SYRINGE | INTRAVENOUS | Status: DC | PRN
  Administered 2020-11-11 (×2): 80 ug via INTRAVENOUS

## 2020-11-10 MED ORDER — LIDOCAINE HCL (PF) 1 % IJ SOLN: 30.0000 mL | INTRAMUSCULAR | Status: DC | PRN

## 2020-11-10 MED ORDER — DIPHENHYDRAMINE HCL 50 MG/ML IJ SOLN: 12.5000 mg | INTRAMUSCULAR | Status: DC | PRN

## 2020-11-10 MED ORDER — SODIUM CHLORIDE 0.9 % IV SOLN
5.0000 10*6.[IU] | Freq: Once | INTRAVENOUS | Status: AC
  Administered 2020-11-10: 5 10*6.[IU] via INTRAVENOUS
  Filled 2020-11-10: qty 5

## 2020-11-10 MED ORDER — LACTATED RINGERS IV SOLN: INTRAVENOUS | Status: DC

## 2020-11-10 MED ORDER — OXYTOCIN BOLUS FROM INFUSION: 333.0000 mL | Freq: Once | INTRAVENOUS | Status: DC

## 2020-11-10 NOTE — Anesthesia Preprocedure Evaluation (Signed)
Anesthesia Evaluation  Patient identified by MRN, date of birth, ID band Patient awake    Reviewed: Allergy & Precautions, H&P , NPO status , Patient's Chart, lab work & pertinent test results  Airway Mallampati: I  TM Distance: >3 FB Neck ROM: full    Dental  (+) Teeth Intact   Pulmonary neg pulmonary ROS,    Pulmonary exam normal breath sounds clear to auscultation       Cardiovascular negative cardio ROS Normal cardiovascular exam     Neuro/Psych negative neurological ROS  negative psych ROS   GI/Hepatic negative GI ROS, Neg liver ROS,   Endo/Other  negative endocrine ROS  Renal/GU negative Renal ROS  negative genitourinary   Musculoskeletal negative musculoskeletal ROS (+)   Abdominal Normal abdominal exam  (+)   Peds  Hematology negative hematology ROS (+)   Anesthesia Other Findings   Reproductive/Obstetrics (+) Pregnancy                             Anesthesia Physical Anesthesia Plan  ASA: II  Anesthesia Plan: General   Post-op Pain Management:    Induction:   PONV Risk Score and Plan:   Airway Management Planned:   Additional Equipment:   Intra-op Plan:   Post-operative Plan:   Informed Consent: I have reviewed the patients History and Physical, chart, labs and discussed the procedure including the risks, benefits and alternatives for the proposed anesthesia with the patient or authorized representative who has indicated his/her understanding and acceptance.       Plan Discussed with:   Anesthesia Plan Comments:         Anesthesia Quick Evaluation

## 2020-11-10 NOTE — Anesthesia Procedure Notes (Signed)
Epidural Patient location during procedure: OB Start time: 11/10/2020 10:35 PM End time: 11/10/2020 10:39 PM  Staffing Anesthesiologist: Leilani Able, MD Performed: anesthesiologist   Preanesthetic Checklist Completed: patient identified, IV checked, site marked, risks and benefits discussed, surgical consent, monitors and equipment checked, pre-op evaluation and timeout performed  Epidural Patient position: sitting Prep: DuraPrep and site prepped and draped Patient monitoring: continuous pulse ox and blood pressure Approach: midline Location: L3-L4 Injection technique: LOR air  Needle:  Needle type: Tuohy  Needle gauge: 17 G Needle length: 9 cm and 9 Needle insertion depth: 8 cm Catheter type: closed end flexible Catheter size: 19 Gauge Catheter at skin depth: 13 cm Test dose: negative and Other  Assessment Events: blood not aspirated, injection not painful, no injection resistance, no paresthesia and negative IV test  Additional Notes Reason for block:procedure for pain

## 2020-11-10 NOTE — Progress Notes (Signed)
Patient ID: Tina Maynard, female   DOB: 1986-11-13, 34 y.o.   MRN: [id number] Pt reports increasing pain with contractions VSS GEN - NAD EFM - 140, cat 1 TOCO - irregular contractions q 1-25mins SVE - 7/90/0  A/P: Progressing in labor         Discussed augmentation with pitocin, pain control with epidural: pt still wants to proceed with no intervention at this time         Expectant mgmt

## 2020-11-10 NOTE — H&P (Signed)
Tina Maynard is a 34 y.o.G2P1001  female presenting for elective iol. Pt is dated per 10week [place]. She has had benign prenatal course only complicated by chlamydia infection in first trimester- treated. She is GBS positive. She declined chromosomal and genetic testing OB History    Gravida  2   Para  1   Term      Preterm      AB      Living        SAB      IAB      Ectopic      Multiple      Live Births             Past Medical History:  Diagnosis Date  . Medical history non-contributory    Past Surgical History:  Procedure Laterality Date  . NO PAST SURGERIES     Family History: family history is not on file. Social History:  reports that she has never smoked. She has never used smokeless tobacco. She reports current alcohol use. She reports that she does not use drugs.     Maternal Diabetes: No Genetic Screening: Declined Maternal Ultrasounds/Referrals: Normal Fetal Ultrasounds or other Referrals:  None Maternal Substance Abuse:  No Significant Maternal Medications:  None Significant Maternal Lab Results:  Group B Strep negative Other Comments:  None  Review of Systems  Constitutional: Negative for activity change.  Eyes: Negative for visual disturbance.  Respiratory: Negative for chest tightness and shortness of breath.   Cardiovascular: Negative for chest pain, palpitations and leg swelling.  Gastrointestinal: Negative for abdominal pain.  Endocrine: Negative for polyphagia.  Genitourinary: Negative for menstrual problem.  Musculoskeletal: Negative for gait problem.  Psychiatric/Behavioral: The patient is not nervous/anxious.    Maternal Medical History:  Reason for admission: Elective iol  Contractions: Frequency: irregular.   Perceived severity is mild.    Fetal activity: Perceived fetal activity is normal.    Prenatal complications: no prenatal complications Prenatal Complications - Diabetes: none.    Dilation: 1.5 Effacement (%):  50 Station: -3 Exam by:: Janese Banks RN Blood pressure 103/65, pulse 71, temperature 99.8 F (37.7 C), temperature source Oral, resp. rate 14, height 5\' 5"  (1.651 m), weight 82.6 kg. Maternal Exam:  Uterine Assessment: Contraction strength is mild.  Contraction frequency is irregular.   Abdomen: Patient reports generalized tenderness.  Estimated fetal weight is AGA.   Fetal presentation: vertex  Introitus: Normal vulva. Vulva is negative for condylomata and lesion.  Normal vagina.  Vagina is negative for condylomata.  Pelvis: adequate for delivery.   Cervix: Cervix evaluated by digital exam.     Fetal Exam Fetal Monitor Review: Baseline rate: 140.  Variability: moderate (6-25 bpm).   Pattern: accelerations present and no decelerations.    Fetal State Assessment: Category I - tracings are normal.     Physical Exam Vitals and nursing note reviewed. Exam conducted with a chaperone present.  Constitutional:      Appearance: Normal appearance.  Cardiovascular:     Pulses: Normal pulses.  Pulmonary:     Effort: Pulmonary effort is normal.  Abdominal:     Tenderness: There is generalized abdominal tenderness.  Genitourinary:    General: Normal vulva.  Vulva is no lesion.  Skin:    General: Skin is warm.     Capillary Refill: Capillary refill takes 2 to 3 seconds.  Neurological:     General: No focal deficit present.     Mental Status: She is alert and  oriented to person, place, and time. Mental status is at baseline.  Psychiatric:        Mood and Affect: Mood normal.        Behavior: Behavior normal.        Thought Content: Thought content normal.        Judgment: Judgment normal.     Prenatal labs: ABO, Rh: --/--/O POS (12/09 0115) Antibody: NEG (12/09 0115) Rubella: Immune (12/09 0000) RPR:    HBsAg:    HIV: Non-reactive (12/09 0000)  GBS: Positive/-- (12/09 0000)   Assessment/Plan: 27NT [id number] female here for elective iol at 39 1/7wks -Admit -S/p  Cytotec overnight x 2 - Pain control prn - Treat GBS - AROM performed with clear fluid  Janean Sark Nami Strawder 11/10/2020, 9:19 AM

## 2020-11-10 NOTE — Progress Notes (Addendum)
Patient ID: Tina Maynard, female   DOB: 07-24-1986, 34 y.o.   MRN: [id number] Pt reports increasing discomfort with contractions. +Fms. Still leaking clear fluid VSS GEN - NAD EFM - cat 1, 140s TOCO - ctxs q SVE 4/80/-2  A/P: Progressing in latent labor         Pt prefers to defer pitocin         Pt desires no pain medication at this time;considering pain med free labor         Expectant mgmt - recheck in 4hrs

## 2020-11-11 MED ORDER — WITCH HAZEL-GLYCERIN EX PADS
1.0000 "application " | MEDICATED_PAD | CUTANEOUS | Status: DC | PRN
  Administered 2020-11-11: 1 via TOPICAL

## 2020-11-11 MED ORDER — IBUPROFEN 600 MG PO TABS
600.0000 mg | ORAL_TABLET | Freq: Four times a day (QID) | ORAL | Status: DC
  Administered 2020-11-11 – 2020-11-12 (×6): 600 mg via ORAL
  Filled 2020-11-11 (×6): qty 1

## 2020-11-11 MED ORDER — ZOLPIDEM TARTRATE 5 MG PO TABS: 5.0000 mg | ORAL_TABLET | Freq: Every evening | ORAL | Status: DC | PRN

## 2020-11-11 MED ORDER — PRENATAL MULTIVITAMIN CH
1.0000 | ORAL_TABLET | Freq: Every day | ORAL | Status: DC
  Administered 2020-11-11 – 2020-11-12 (×2): 1 via ORAL
  Filled 2020-11-11 (×2): qty 1

## 2020-11-11 MED ORDER — TETANUS-DIPHTH-ACELL PERTUSSIS 5-2.5-18.5 LF-MCG/0.5 IM SUSY: 0.5000 mL | PREFILLED_SYRINGE | Freq: Once | INTRAMUSCULAR | Status: DC

## 2020-11-11 MED ORDER — SENNOSIDES-DOCUSATE SODIUM 8.6-50 MG PO TABS
2.0000 | ORAL_TABLET | ORAL | Status: DC
  Administered 2020-11-11 – 2020-11-12 (×2): 2 via ORAL
  Filled 2020-11-11 (×2): qty 2

## 2020-11-11 MED ORDER — SIMETHICONE 80 MG PO CHEW: 80.0000 mg | CHEWABLE_TABLET | ORAL | Status: DC | PRN

## 2020-11-11 MED ORDER — DIPHENHYDRAMINE HCL 25 MG PO CAPS
25.0000 mg | ORAL_CAPSULE | Freq: Four times a day (QID) | ORAL | Status: DC | PRN
Start: 2020-11-11 — End: 2020-11-13

## 2020-11-11 MED ORDER — OXYCODONE HCL 5 MG PO TABS: 5.0000 mg | ORAL_TABLET | ORAL | Status: DC | PRN

## 2020-11-11 MED ORDER — COCONUT OIL OIL
1.0000 | TOPICAL_OIL | Status: DC | PRN
Start: 2020-11-11 — End: 2020-11-13
  Administered 2020-11-12: 1 via TOPICAL

## 2020-11-11 MED ORDER — BENZOCAINE-MENTHOL 20-0.5 % EX AERO
1.0000 "application " | INHALATION_SPRAY | CUTANEOUS | Status: DC | PRN
  Administered 2020-11-11: 1 via TOPICAL
  Filled 2020-11-11: qty 56

## 2020-11-11 MED ORDER — OXYTOCIN 10 UNIT/ML IJ SOLN
INTRAMUSCULAR | Status: AC
  Administered 2020-11-11: 10 [IU]
  Filled 2020-11-11: qty 1

## 2020-11-11 MED ORDER — ONDANSETRON HCL 4 MG PO TABS: 4.0000 mg | ORAL_TABLET | ORAL | Status: DC | PRN

## 2020-11-11 MED ORDER — DIBUCAINE (PERIANAL) 1 % EX OINT: 1.0000 "application " | TOPICAL_OINTMENT | CUTANEOUS | Status: DC | PRN

## 2020-11-11 MED ORDER — ONDANSETRON HCL 4 MG/2ML IJ SOLN: 4.0000 mg | INTRAMUSCULAR | Status: DC | PRN

## 2020-11-11 MED ORDER — OXYCODONE HCL 5 MG PO TABS: 10.0000 mg | ORAL_TABLET | ORAL | Status: DC | PRN

## 2020-11-11 MED ORDER — ACETAMINOPHEN 325 MG PO TABS: 650.0000 mg | ORAL_TABLET | ORAL | Status: DC | PRN

## 2020-11-11 NOTE — Progress Notes (Signed)
Patient ID: Tina Maynard, female   DOB: October 01, 1986, 34 y.o.   MRN: [id number] Pt comfortable with epidural. Denies fever or chills VSS GEN - NAD EFM - cat 1, 145 TOCO - ctxs q 1-50mins SVE - 9/100/0  A/P: Pt progressing well. Agreed to pitocin so now at         Recheck in 2hrs or prn; IUPC if no change noted.

## 2020-11-11 NOTE — Lactation Note (Signed)
This note was copied from a baby's chart. Lactation Consultation Note  Patient Name: Tina Maynard [id number] Date: 11/11/2020 Reason for consult: Initial assessment;Term  Visited with mom of 15 hours old FT female, she's a P2. She BF her first child for 1 month and the biggest BF difficulty she faced was a breast infection, that's the reason why she stopped BF. Mom's first child is 32 y.o now. She has a DEBP at home.  Offered assistance with latch and mom agreed to wake baby up to feed, he just had started cueing during [facility] consultation. LC took baby STS to mother's left breast in cross cradle position and he was able to latch easily, almost right away.   Mom needed some assistance with positioning, she kept using "scissors hold" instead of her thumb and index finger, which didn't allow a deep latch for baby. LC helped with pillows and to position infant, once mom started following LC advice, latch was much deeper and baby was able to feed for 10 minutes with a few audible swallows noted upon breast compressions.  Reviewed normal newborn behavior, feeding cues, cluster feeding, size of baby's stomach and lactogenesis II. Mom asked LC to call back NT to do baby's bath, she wanted to take a nap afterwards. Reminded mom about the uninterrupted 1 hour of STS care after baby's bath, she understands that she needs to be awake and alert when holding baby.   Feeding plan  1. Encouraged mom to feed baby STS 8-12 times/24 hours or sooner if feeding cues are present 2. Hand expression and spoon feeding were also encouraged  BF brochure, BF resources and feeding diary were reviewed. No support person in mom's room at the time of [facility] consultation. Mom reported all questions and concerns were answered, she's aware of LC OP services and will call PRN.   Maternal Data Formula Feeding for Exclusion: No Has patient been taught Hand Expression?: Yes Does the patient have breastfeeding experience prior to this  delivery?: Yes  Feeding Feeding Type: Breast Fed  LATCH Score Latch: Grasps breast easily, tongue down, lips flanged, rhythmical sucking.  Audible Swallowing: A few with stimulation  Type of Nipple: Everted at rest and after stimulation  Comfort (Breast/Nipple): Soft / non-tender  Hold (Positioning): Assistance needed to correctly position infant at breast and maintain latch.  LATCH Score: 8  Interventions Interventions: Breast feeding basics reviewed;Assisted with latch;Skin to skin;Breast massage;Hand express;Breast compression;Adjust position;Support pillows  Lactation Tools Discussed/Used WIC Program: No   Consult Status Consult Status: Follow-up Date: 11/12/20 Follow-up type: In-patient    Clarissa Laird Venetia Constable 11/11/2020, 9:23 PM

## 2020-11-12 LAB — CBC
HCT: 29 % — ABNORMAL LOW (ref 36.0–46.0)
Hemoglobin: 9.8 g/dL — ABNORMAL LOW (ref 12.0–15.0)
MCH: 28.7 pg (ref 26.0–34.0)
MCHC: 33.8 g/dL (ref 30.0–36.0)
MCV: 85 fL (ref 80.0–100.0)
Platelets: 175 10*3/uL (ref 150–400)
RBC: 3.41 MIL/uL — ABNORMAL LOW (ref 3.87–5.11)
RDW: 15.2 % (ref 11.5–15.5)
WBC: 10.2 10*3/uL (ref 4.0–10.5)
nRBC: 0 % (ref 0.0–0.2)

## 2020-11-12 MED ORDER — IBUPROFEN 600 MG PO TABS: 600.0000 mg | ORAL_TABLET | Freq: Four times a day (QID) | ORAL | 0 refills | Status: AC

## 2020-11-12 NOTE — Anesthesia Postprocedure Evaluation (Signed)
Anesthesia Post Note  Patient: Animal nutritionist  Procedure(s) Performed: AN AD HOC LABOR EPIDURAL     Patient location during evaluation: Mother Baby Anesthesia Type: General Level of consciousness: awake and alert Pain management: pain level controlled Vital Signs Assessment: post-procedure vital signs reviewed and stable Respiratory status: spontaneous breathing, nonlabored ventilation and respiratory function stable Cardiovascular status: stable Postop Assessment: no headache, no backache and epidural receding Anesthetic complications: no   No complications documented.  Last Vitals:  Vitals:   11/11/20 2329 11/12/20 0509  BP: 112/77 107/73  Pulse: 70 70  Resp: 16 18  Temp: 36.9 C 37 C  SpO2: 100% 100%    Last Pain:  Vitals:   11/12/20 0815  TempSrc:   PainSc: 0-No pain   Pain Goal: Patients Stated Pain Goal: 5 (11/10/20 1840)              Epidural/Spinal Function Cutaneous sensation: Normal sensation (11/12/20 0815)  Tina Maynard N

## 2020-11-12 NOTE — Progress Notes (Signed)
PPD #1 No problems, would like to go home today Afeb, VSS Fundus firm, NT at U-1 Continue routine postpartum care, d/c home later today if everything gets done with baby

## 2020-11-12 NOTE — Lactation Note (Signed)
This note was copied from a baby's chart. Lactation Consultation Note  Patient Name: Tina Maynard [id number] Date: 11/12/2020 Reason for consult: Follow-up assessment;Mother's request;Difficult latch;Term;Nipple pain/trauma;Hyperbilirubinemia  Infant is 39 weeks 33 hours old. Infant latched at the breast in cradle shallow at the nipple. Mom states she had some pain with the latch. LC assisted Mom switching to football with education on how to keep the lips flanged top and bottom. LC also explained to Mom importance of cheeks and nose touching her breasts and having infant tummy to tummy. Infant was also in and outfit and we removed it placing him STS. Infant showed signs of milk transfer.   Prior to latching, LC did some suck training and infant able to extend the tongue pass the gum line. Initially he was using gums top and bottom on the finger.   Mom had a compression stripe on the left side but no signs of abrasion on both breasts. LC showed Mom how to EBM for nipple care. LC also provided comfort gels for nipple care with instructions to d/c after 6 days, rinse off after use and not to use with coconut oil. LC spoke to RN, Cristal Deer, to provide coconut oil for nipple care as well.   Infant had 3 urine and 4 stools since midnight.   Mom has a electric breast pump at home.   Plan 1. To feed based on cues 8-12 x in 24 hour period no more than 4 hours without an attempt.           2. Mom to offer both breasts, look for signs of milk transfer and offer breast compression during feed to keep infant active during feedings.           3. LC reviewed signs, symptoms and treatment of engorgement.           4. LC reviewed how to track feedings, urine and stool on feeding logs.           5. LC reviewed outpatient and inpatient services.            6. Infant d/c pending bilirubin results.

## 2020-11-12 NOTE — Discharge Summary (Signed)
Postpartum Discharge Summary      Patient Name: Tina Maynard DOB: Aug 14, 1986 MRN: [id number]  Date of admission: 11/10/2020 Delivery date:11/11/2020  Delivering provider: [facility]  Date of discharge: 11/12/2020  Admitting diagnosis: Pregnant [Z34.90] Intrauterine pregnancy: [redacted]w[redacted]d     Secondary diagnosis:  Active Problems:   Pregnant   SVD (spontaneous vaginal delivery)     Discharge diagnosis: Term Pregnancy Delivered                                              Hospital course: Induction of Labor With Vaginal Delivery   34 y.o. yo G2P1001 at [redacted]w[redacted]d was admitted to the hospital 11/10/2020 for induction of labor.  Indication for induction: Favorable cervix at term.  Patient had an uncomplicated labor course as follows: Membrane Rupture Time/Date: 9:29 AM ,11/10/2020   Delivery Method:Vaginal, Spontaneous  Episiotomy:   Lacerations:  None  Details of delivery can be found in separate delivery note.  Patient had a routine postpartum course. Patient is discharged home 11/12/20.  Newborn Data: Birth date:11/11/2020  Birth time:7:44 AM  Gender:Female  Living status:Living  Apgars:8 ,9  Weight:3374 g    Physical exam  Vitals:   11/11/20 1600 11/11/20 1948 11/11/20 2329 11/12/20 0509  BP: 133/63 110/76 112/77 107/73  Pulse: 68 66 70 70  Resp: 18 16 16 18   Temp: 98.7 F (37.1 C) 98.9 F (37.2 C) 98.4 F (36.9 C) 98.6 F (37 C)  TempSrc: Oral Oral Oral Oral  SpO2:  100% 100% 100%  Weight:      Height:       General: alert Lochia: appropriate Uterine Fundus: firm  Labs: Lab Results  Component Value Date   WBC 10.2 11/12/2020   HGB 9.8 (L) 11/12/2020   HCT 29.0 (L) 11/12/2020   MCV 85.0 11/12/2020   PLT 175 11/12/2020   CMP Latest Ref Rng & Units 07/14/2015  Glucose 65 - 99 mg/dL 96  BUN 6 - 20 mg/dL 8  Creatinine 09/13/2015 - 5.88 mg/dL 5.02  Sodium 7.74 - 128 mmol/L 136  Potassium 3.5 - 5.1 mmol/L 3.1(L)  Chloride 101 - 111 mmol/L 106  CO2 19 - 32  mEq/L -  Calcium 8.4 - 10.5 mg/dL -  Total Protein 6.0 - 8.3 g/dL -  Total Bilirubin 0.3 - 1.2 mg/dL -  Alkaline Phos 39 - 786 U/L -  AST 0 - 37 U/L -  ALT 0 - 35 U/L -   Edinburgh Score: Edinburgh Postnatal Depression Scale Screening Tool 11/11/2020  I have been able to laugh and see the funny side of things. 0  I have looked forward with enjoyment to things. 1  I have blamed myself unnecessarily when things went wrong. 1  I have been anxious or worried for no good reason. 1  I have felt scared or panicky for no good reason. 0  Things have been getting on top of me. 1  I have been so unhappy that I have had difficulty sleeping. 0  I have felt sad or miserable. 0  I have been so unhappy that I have been crying. 1  The thought of harming myself has occurred to me. 0  Edinburgh Postnatal Depression Scale Total 5      After visit meds:  Allergies as of 11/12/2020   No Known Allergies  Medication List    STOP taking these medications   ondansetron 4 MG tablet Commonly known as: ZOFRAN     TAKE these medications   acetaminophen 500 MG tablet Commonly known as: TYLENOL Take 1,000 mg by mouth every 6 (six) hours as needed. For pain   ibuprofen 600 MG tablet Commonly known as: ADVIL Take 1 tablet (600 mg total) by mouth every 6 (six) hours. What changed:   medication strength  how much to take  when to take this  reasons to take this  additional instructions   multivitamin with minerals Tabs tablet Take 1 tablet by mouth daily.        Discharge home in stable condition Infant Feeding: Breast Infant Disposition:home with mother Discharge instruction: per After Visit Summary and Postpartum booklet. Activity: Advance as tolerated. Pelvic rest for 6 weeks.  Diet: routine diet  Postpartum Appointment:6 weeks Follow up Visit:  Follow-up Information    Edwinna Areola, DO. Schedule an appointment as soon as possible for a visit in 6 week(s).    Specialty: Obstetrics and Gynecology Contact information: [address] [place] [address] [phone]                   11/12/2020 Zenaida Niece, MD

## 2020-11-12 NOTE — Discharge Instructions (Signed)
As per discharge pamphlet °
